# Patient Record
Sex: Female | Born: 1995 | Race: White | Hispanic: No | Marital: Single | Smoking: Never smoker
Health system: Southern US, Community
[De-identification: ages and names within clinical notes are randomized; demographics above are authoritative.]

## PROBLEM LIST (undated history)

## (undated) DIAGNOSIS — F39 Unspecified mood [affective] disorder: Secondary | ICD-10-CM

## (undated) DIAGNOSIS — F419 Anxiety disorder, unspecified: Secondary | ICD-10-CM

## (undated) DIAGNOSIS — F32A Depression, unspecified: Secondary | ICD-10-CM

## (undated) DIAGNOSIS — N946 Dysmenorrhea, unspecified: Secondary | ICD-10-CM

## (undated) DIAGNOSIS — G8929 Other chronic pain: Secondary | ICD-10-CM

## (undated) HISTORY — DX: Dysmenorrhea, unspecified: N94.6

## (undated) HISTORY — DX: Anxiety disorder, unspecified: F41.9

## (undated) HISTORY — DX: Other chronic pain: G89.29

## (undated) HISTORY — DX: Unspecified mood (affective) disorder: F39

## (undated) HISTORY — DX: Depression, unspecified: F32.A

## (undated) HISTORY — PX: NO PAST SURGERIES: SHX2092

---
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
## 2015-06-10 ENCOUNTER — Emergency Department (HOSPITAL_COMMUNITY)
Admission: EM | Admit: 2015-06-10 | Discharge: 2015-06-10 | Disposition: A | Discharge status: Home / Self Care | Payer: 59 | Attending: Emergency Medicine | Admitting: Emergency Medicine

## 2015-06-10 ENCOUNTER — Encounter (HOSPITAL_COMMUNITY): Payer: Self-pay | Admitting: *Deleted

## 2015-06-10 DIAGNOSIS — R21 Rash and other nonspecific skin eruption: Secondary | ICD-10-CM | POA: Diagnosis not present

## 2015-06-10 DIAGNOSIS — M79669 Pain in unspecified lower leg: Secondary | ICD-10-CM | POA: Diagnosis present

## 2015-06-10 DIAGNOSIS — F17210 Nicotine dependence, cigarettes, uncomplicated: Secondary | ICD-10-CM | POA: Insufficient documentation

## 2015-06-10 DIAGNOSIS — R000 Tachycardia, unspecified: Secondary | ICD-10-CM | POA: Insufficient documentation

## 2015-06-10 DIAGNOSIS — R252 Cramp and spasm: Secondary | ICD-10-CM | POA: Diagnosis not present

## 2015-06-10 DIAGNOSIS — Z87440 Personal history of urinary (tract) infections: Secondary | ICD-10-CM | POA: Insufficient documentation

## 2015-06-10 LAB — BASIC METABOLIC PANEL
Anion gap: 7 (ref 5–15)
BUN: 15 mg/dL (ref 6–20)
CO2: 26 mmol/L (ref 22–32)
Calcium: 9.2 mg/dL (ref 8.9–10.3)
Chloride: 105 mmol/L (ref 101–111)
Creatinine, Ser: 0.74 mg/dL (ref 0.44–1.00)
GFR calc Af Amer: >60 mL/min (ref >60)
GFR calc non Af Amer: >60 mL/min (ref >60)
Glucose, Bld: 89 mg/dL (ref 65–99)
Potassium: 4 mmol/L (ref 3.5–5.1)
Sodium: 138 mmol/L (ref 135–145)

## 2015-06-10 LAB — CBC
HCT: 42.1 % (ref 36.0–46.0)
Hemoglobin: 14.7 g/dL (ref 12.0–15.0)
MCH: 32 pg (ref 26.0–34.0)
MCHC: 34.9 g/dL (ref 30.0–36.0)
MCV: 91.7 fL (ref 78.0–100.0)
Platelets: 255 10*3/uL (ref 150–400)
RBC: 4.59 MIL/uL (ref 3.87–5.11)
RDW: 11.9 % (ref 11.5–15.5)
WBC: 8.5 10*3/uL (ref 4.0–10.5)

## 2015-06-10 LAB — CK: Total CK: 80 U/L (ref 38–234)

## 2015-06-10 MED ORDER — IBUPROFEN 400 MG PO TABS
400.0000 mg | ORAL_TABLET | Freq: Once | ORAL | Status: DC
  Filled 2015-06-10: qty 1

## 2015-06-10 NOTE — ED Notes (Addendum)
Pt states pain to lower legs, stating "swelling behind both knees with pain going down both of my legs, towards the outside." Symptoms x 2-3 days. Rash to lower legs as well which began after beginning bactrim for a UTI, recently switched to keflex. No obvious swelling, no pain with palpation at this time.

## 2015-06-10 NOTE — ED Provider Notes (Signed)
CSN: [id number]     Arrival date & time 06/10/15  1529 History   First MD Initiated Contact with Patient 06/10/15 1614     Chief Complaint  Patient presents with  . Leg Pain     (Consider location/radiation/quality/duration/timing/severity/associated sxs/prior Treatment) HPI Sally Pittman is a 19 y.o. female who presents to the ED with lower extremity pain x 2 days. Patient reports rash after taking Bactrim for a UTI and being changed to Keflex. She continues to have the rash. She does not feel that is related to the pain in her legs. Patient states that she feels like there is swelling behind her knees. She denies shortness of breath or other problems. Patient has implant for birth control.   History reviewed. No pertinent past medical history. History reviewed. No pertinent past surgical history. No family history on file. Social History  Substance Use Topics  . Smoking status: Current Some Day Smoker    Types: Cigarettes  . Smokeless tobacco: None  . Alcohol Use: No   OB History    No data available     Review of Systems Negative except as stated in HPI   Allergies  Bactrim  Home Medications   Prior to Admission medications   Not on File   BP 110/70 mmHg  Pulse 80  Temp(Src) 98 F (36.7 C) (Oral)  Resp 18  Ht 5\' 4"  (1.626 m)  Wt 40.824 kg  BMI 15.44 kg/m2  SpO2 100%  LMP  Physical Exam  Constitutional: She is oriented to person, place, and time. She appears well-developed and well-nourished.  HENT:  Head: Normocephalic and atraumatic.  Eyes: EOM are normal.  Neck: Neck supple.  Cardiovascular: Regular rhythm and intact distal pulses.  Tachycardia present.   Pulmonary/Chest: Effort normal and breath sounds normal.  Abdominal: There is no tenderness.  Musculoskeletal: Normal range of motion.  Pedal pulses 2+ bilateral, adequate circulation, good touch sensation, equal strength. Dorsi flexion and plantar flexion without difficulty. Unable to reproduce the  cramp like pain that the patient describes that comes and goes. No calf tenderness, no swelling noted. Full range of motion of lower extremities without pain.   Neurological: She is alert and oriented to person, place, and time. No cranial nerve deficit.  Skin: Skin is warm and dry. Rash noted.  Psychiatric: She has a normal mood and affect. Her behavior is normal.  Nursing note and vitals reviewed.   ED Course  Procedures (including critical care time) Dr. Manus Gunningancour in to examine the patient and discuss plan for labs.   Labs Review Results for orders placed or performed during the hospital encounter of 06/10/15 (from the past 24 hour(s))  CK     Status: None   Collection Time: 06/10/15  6:01 PM  Result Value Ref Range   Total CK 80 38 - 234 U/L  CBC     Status: None   Collection Time: 06/10/15  6:01 PM  Result Value Ref Range   WBC 8.5 4.0 - 10.5 K/uL   RBC 4.59 3.87 - 5.11 MIL/uL   Hemoglobin 14.7 12.0 - 15.0 g/dL   HCT 40.942.1 81.136.0 - 91.446.0 %   MCV 91.7 78.0 - 100.0 fL   MCH 32.0 26.0 - 34.0 pg   MCHC 34.9 30.0 - 36.0 g/dL   RDW 78.211.9 95.611.5 - 21.315.5 %   Platelets 255 150 - 400 K/uL  Basic metabolic panel     Status: None   Collection Time: 06/10/15  6:01 PM  Result Value Ref Range   Sodium 138 135 - 145 mmol/L   Potassium 4.0 3.5 - 5.1 mmol/L   Chloride 105 101 - 111 mmol/L   CO2 26 22 - 32 mmol/L   Glucose, Bld 89 65 - 99 mg/dL   BUN 15 6 - 20 mg/dL   Creatinine, Ser 1.61 0.44 - 1.00 mg/dL   Calcium 9.2 8.9 - 09.6 mg/dL   GFR calc non Af Amer >60 >60 mL/min   GFR calc Af Amer >60 >60 mL/min   Anion gap 7 5 - 15     MDM  18 y.o. female with bilateral leg cramps off and on x 2 days stable for d/c without focal neuro deficits and no calf tenderness on exam. No concern at this time for DVT or PE. Discussed with the patient and her mother clinical and lab findings and need for follow up with PCP.    Final diagnoses:  Cramp of both lower extremities       Janne Napoleon,  NP 06/10/15 1941  Glynn Octave, MD 06/11/15 [phone]

## 2015-06-10 NOTE — Discharge Instructions (Signed)
All of your blood work was normal today. Be sure you are drinking plenty of fluids to prevent dehydration. Take ibuprofen as needed for pain. Follow up with your doctor. Return here as needed for any problems.

## 2015-06-10 NOTE — ED Notes (Signed)
Pt's mother came out to desk.  Informed labs were resulted and provider would be in shortly.

## 2015-06-10 NOTE — ED Notes (Signed)
Pt talking on cell phone.  Given warm blanket.  Mother becoming agitated at wait time.

## 2015-12-06 ENCOUNTER — Encounter: Payer: Self-pay | Admitting: *Deleted

## 2015-12-07 ENCOUNTER — Encounter: Payer: 59 | Admitting: Cardiology

## 2015-12-07 ENCOUNTER — Encounter: Payer: Self-pay | Admitting: Cardiology

## 2015-12-07 NOTE — Progress Notes (Signed)
No show  This encounter was created in error - please disregard.

## 2015-12-17 ENCOUNTER — Encounter: Payer: Self-pay | Admitting: Cardiovascular Disease

## 2015-12-17 ENCOUNTER — Ambulatory Visit (INDEPENDENT_AMBULATORY_CARE_PROVIDER_SITE_OTHER): Payer: 59 | Admitting: Cardiovascular Disease

## 2015-12-17 VITALS — BP 100/78 | HR 69 | Ht 64.0 in | Wt 97.0 lb

## 2015-12-17 DIAGNOSIS — R002 Palpitations: Secondary | ICD-10-CM | POA: Diagnosis not present

## 2015-12-17 DIAGNOSIS — R9431 Abnormal electrocardiogram [ECG] [EKG]: Secondary | ICD-10-CM

## 2015-12-17 DIAGNOSIS — IMO0001 Reserved for inherently not codable concepts without codable children: Secondary | ICD-10-CM

## 2015-12-17 DIAGNOSIS — R030 Elevated blood-pressure reading, without diagnosis of hypertension: Secondary | ICD-10-CM

## 2015-12-17 DIAGNOSIS — Z136 Encounter for screening for cardiovascular disorders: Secondary | ICD-10-CM | POA: Diagnosis not present

## 2015-12-17 DIAGNOSIS — F410 Panic disorder [episodic paroxysmal anxiety] without agoraphobia: Secondary | ICD-10-CM

## 2015-12-17 DIAGNOSIS — F419 Anxiety disorder, unspecified: Secondary | ICD-10-CM

## 2015-12-17 NOTE — Progress Notes (Signed)
Patient ID: Sally Pittman, female   DOB: 11/29/1995, 20 y.o.   MRN: [id number]       CARDIOLOGY CONSULT NOTE  Patient ID: Sally Pittman MRN: [id number] DOB/AGE: 38/07/1995 20 y.o.  Admit date: (Not on file) Primary Physician: Remus LofflerJones, Angel S, PA Referring Physician:   Reason for Consultation: htn, abnormal ecg   HPI: The patient is a 20 year old female who is referred for the evaluation of an abnormal ECG and high blood pressure. ECG performed in the office today which I personally interpreted demonstrated sinus rhythm with RSR prime pattern in leads V1 and V2.  She has been under a lot of stress. Her boyfriend recently started drinking. She has a history of anxiety and panic disorder with panic attacks. She previously tried citalopram and it helped but then she stopped taking it. She restarted it 2 weeks ago. She also takes Xanax and trazodone as needed for sleep. She has a sensation of her "heart beat stopping and skipping". She has associated "hot and cold flashes". She denies exertional chest pain and shortness of breath. She said her blood pressure is always normal at her PCPs office but has been [facility]. She does not recall what the values were. She's had these symptoms since middle school. She has a list of behavioral therapists but is yet to make an appointment. She said her mother and father also make life very stressful for her. Her appetite has been poor over the past 5 days.   Allergies  Allergen Reactions  . Tobacco [Nicotiana Tabacum]     Nausea/dizziness  . Bactrim [Sulfamethoxazole-Trimethoprim] Rash    Current Outpatient Prescriptions  Medication Sig Dispense Refill  . ALPRAZolam (XANAX) 0.5 MG tablet Take 0.5 mg by mouth at bedtime as needed for anxiety.    . citalopram (CELEXA) 20 MG tablet Take 10 mg by mouth daily.     Marland Kitchen. etonogestrel (IMPLANON) 68 MG IMPL implant 1 each by Subdermal route once.    Marland Kitchen. UNKNOWN TO PATIENT Medication for nervousness     . UNKNOWN TO PATIENT Antibiotic for fungus     No current facility-administered medications for this visit.    Past Medical History  Diagnosis Date  . Mood disorder (HCC)   . Chronic pain   . Dysmenorrhea     Past Surgical History  Procedure Laterality Date  . No past surgeries      Social History   Social History  . Marital Status: Single    Spouse Name: N/A  . Number of Children: N/A  . Years of Education: N/A   Occupational History  . Not on file.   Social History Main Topics  . Smoking status: Never Smoker   . Smokeless tobacco: Never Used  . Alcohol Use: No  . Drug Use: Not on file  . Sexual Activity: Not on file   Other Topics Concern  . Not on file   Social History Narrative     No family history of premature CAD in 1st degree relatives.  Prior to Admission medications   Medication Sig Start Date End Date Taking? Authorizing Provider  citalopram (CELEXA) 20 MG tablet Take 20 mg by mouth daily.    Historical Provider, MD  etonogestrel (IMPLANON) 68 MG IMPL implant 1 each by Subdermal route once.    Historical Provider, MD     Review of systems complete and found to be negative unless listed above in HPI     Physical exam Blood pressure  100/78, pulse 69, height 5\' 4"  (1.626 m), weight 97 lb (43.999 kg), SpO2 99 %. General: NAD Neck: No JVD, no thyromegaly or thyroid nodule.  Lungs: Clear to auscultation bilaterally with normal respiratory effort. CV: Nondisplaced PMI. Regular rate and rhythm, normal S1/S2, no S3/S4, no murmur.  No peripheral edema.  No carotid bruit.  Normal pedal pulses.  Abdomen: Soft, nontender, no hepatosplenomegaly, no distention.  Skin: Intact without lesions or rashes.  Neurologic: Alert and oriented x 3.  Psych: Normal affect. Extremities: No clubbing or cyanosis.  HEENT: Normal.   ECG: Most recent ECG reviewed.  Labs:   Lab Results  Component Value Date   WBC 8.5 06/10/2015   HGB 14.7 06/10/2015   HCT 42.1  06/10/2015   MCV 91.7 06/10/2015   PLT 255 06/10/2015   No results for input(s): NA, K, CL, CO2, BUN, CREATININE, CALCIUM, PROT, BILITOT, ALKPHOS, ALT, AST, GLUCOSE in the last 168 hours.  Invalid input(s): LABALBU Lab Results  Component Value Date   CKTOTAL 80 06/10/2015   No results found for: CHOL No results found for: HDL No results found for: LDLCALC No results found for: TRIG No results found for: CHOLHDL No results found for: LDLDIRECT       Studies: No results found.  ASSESSMENT AND PLAN:  1. Elevated BP: Controlled without meds. No changes.  2. Abnormal ECG: ECG performed in the office today which I personally interpreted demonstrated sinus rhythm with RSR prime pattern in leads V1 and V2. This is normal for age.  3. Palpitations with cold/hot flashes: Will check TSH. Likely related to anxiety and panic disorder.  4. Anxiety and panic disorder: I had a very lengthy discussion with her about the importance of both medical therapy combined with cognitive behavioral therapy. I also talked to her about anxiety alleviation techniques.  Dispo: fu prn.   Signed: Prentice DockerSuresh Mirella Gueye, M.D., F.A.C.C.  12/17/2015, 9:02 AM

## 2015-12-17 NOTE — Patient Instructions (Signed)
Your physician recommends that you schedule a follow-up appointment AS NEEDED WITH DR. Purvis SheffieldKONESWARAN   Your physician recommends that you continue on your current medications as directed. Please refer to the Current Medication list given to you today.  Your physician recommends that you return for lab work TSH  [facility]

## 2015-12-23 ENCOUNTER — Encounter: Payer: Self-pay | Admitting: *Deleted

## 2015-12-30 ENCOUNTER — Telehealth: Payer: Self-pay | Admitting: Cardiovascular Disease

## 2015-12-30 NOTE — Telephone Encounter (Signed)
Would like to know results from blood work °

## 2015-12-30 NOTE — Telephone Encounter (Signed)
Patient notified that thyroid lab was normal & letter was mailed for notification on 12/23/2015.

## 2016-03-06 ENCOUNTER — Other Ambulatory Visit: Payer: Self-pay | Admitting: *Deleted

## 2016-07-04 ENCOUNTER — Other Ambulatory Visit: Payer: Self-pay | Admitting: *Deleted

## 2016-07-12 ENCOUNTER — Other Ambulatory Visit: Payer: Self-pay

## 2016-07-12 NOTE — Telephone Encounter (Signed)
Denied, not under my care

## 2016-07-12 NOTE — Telephone Encounter (Signed)
Patient not seen here at our office. Is this your patient? [facility]

## 2016-07-19 ENCOUNTER — Other Ambulatory Visit: Payer: Self-pay | Admitting: Gastroenterology

## 2016-07-19 DIAGNOSIS — R1012 Left upper quadrant pain: Secondary | ICD-10-CM

## 2016-07-19 DIAGNOSIS — R110 Nausea: Secondary | ICD-10-CM

## 2016-07-21 ENCOUNTER — Ambulatory Visit
Admission: RE | Admit: 2016-07-21 | Discharge: 2016-07-21 | Disposition: A | Discharge status: Home / Self Care | Payer: 59 | Source: Ambulatory Visit | Attending: Gastroenterology | Admitting: Gastroenterology

## 2016-07-21 DIAGNOSIS — R110 Nausea: Secondary | ICD-10-CM

## 2016-07-21 DIAGNOSIS — R1012 Left upper quadrant pain: Secondary | ICD-10-CM

## 2017-06-20 IMAGING — RF DG UGI W/ HIGH DENSITY W/KUB
3 series · 15 of 22 positions shown · non-contrast
Comparison: Today's abdominal ultrasound.

CLINICAL DATA: Left upper quadrant pain with nausea and vomiting.

EXAM:
UPPER GI SERIES WITH KUB
TECHNIQUE: After obtaining a scout radiograph a routine upper GI series was
performed using thin and thick barium
FLUOROSCOPY TIME:  Fluoroscopy Time:  3 minutes and 54 seconds
Radiation Exposure Index (if provided by the fluoroscopic device):
158 mGy
Number of Acquired Spot Images: 8

[Series 1: one shot · 7 of 11 slices shown (1 of 2)]
[im 1/11]
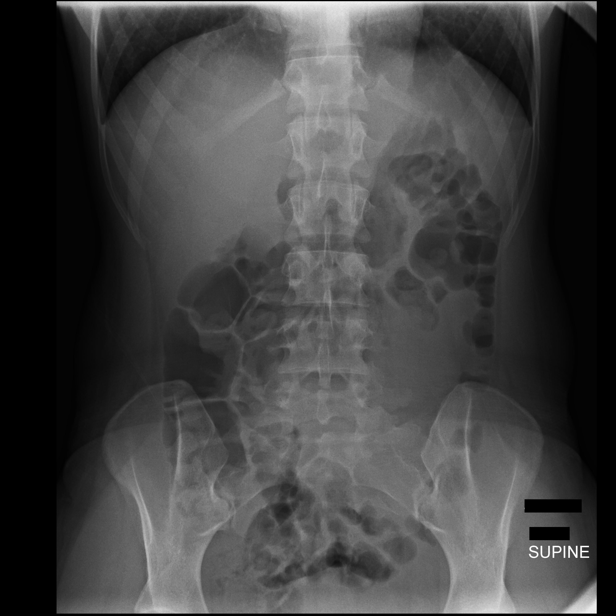
[im 3/11]
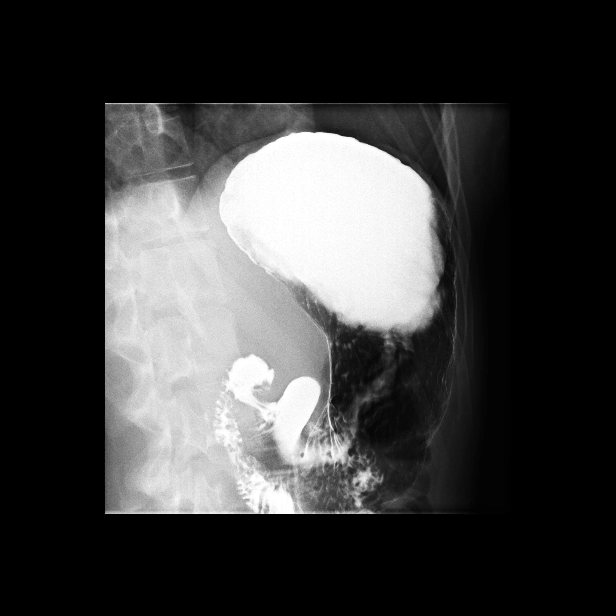
[im 4/11]
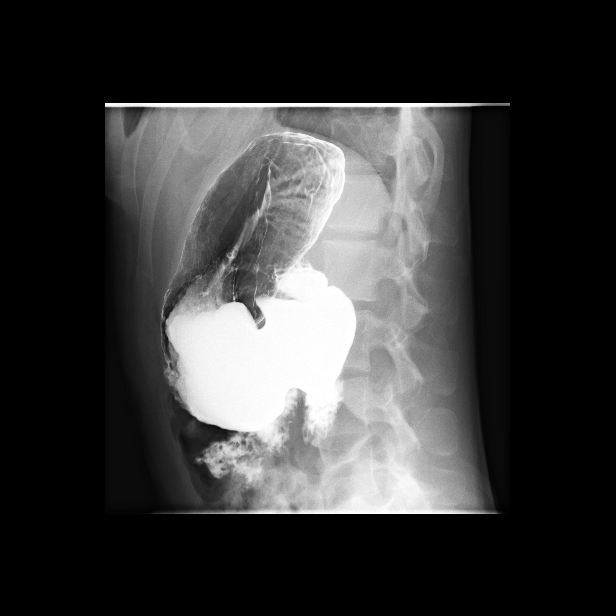
[im 6/11]
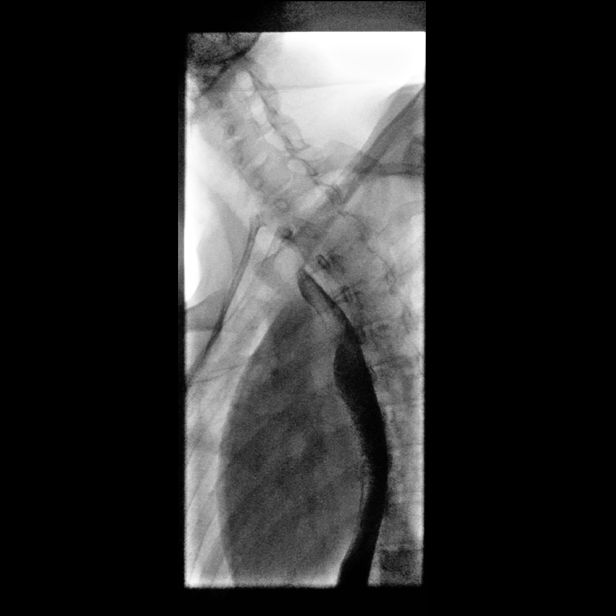
[im 7/11]
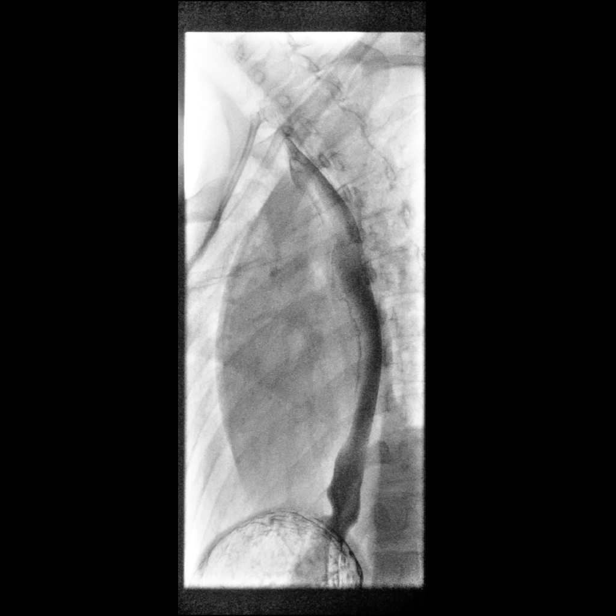
[im 9/11]
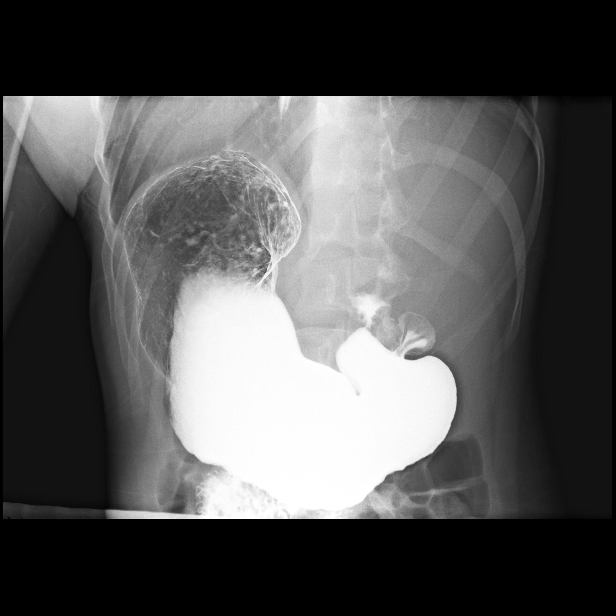
[im 10/11]
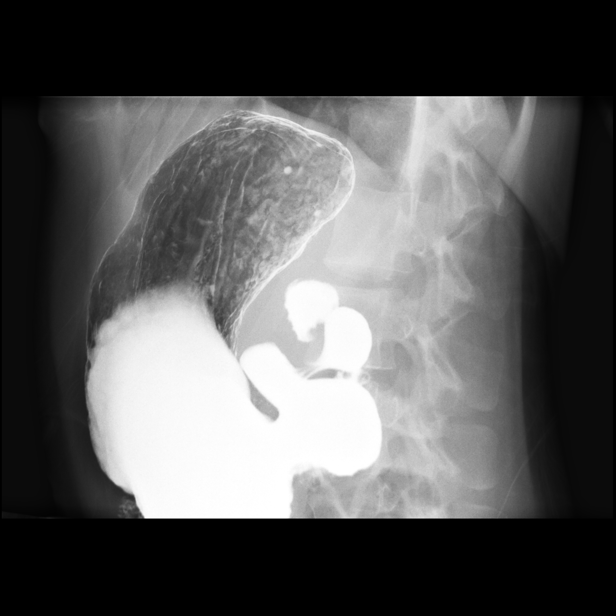

[Series 2: sequence · 2 acquisitions, 6 frames shown]
[im 1/2]
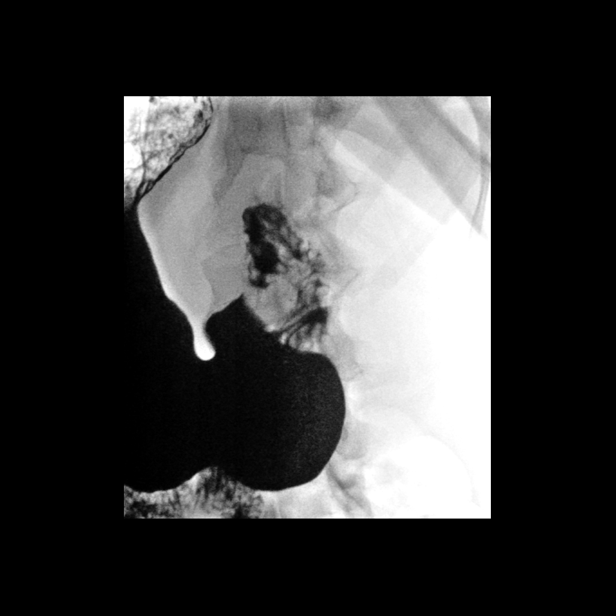
[im 1/2]
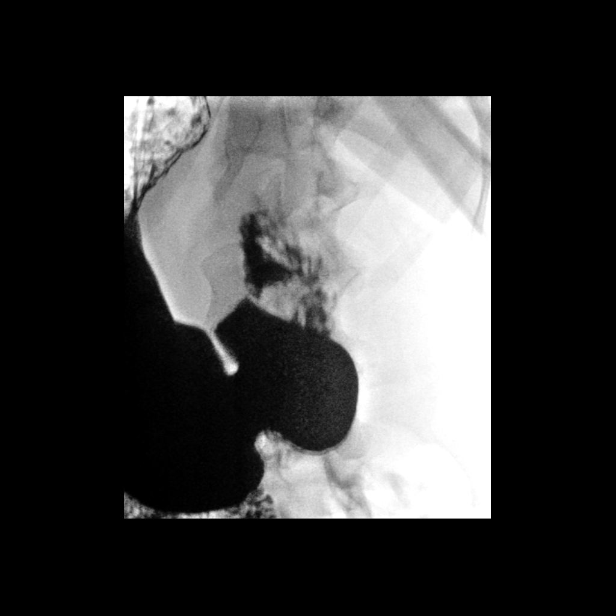
[im 1/2]
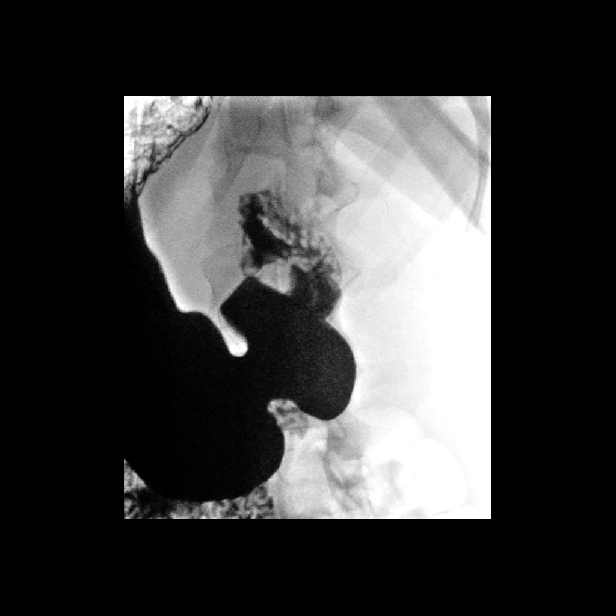
[im 2/2]
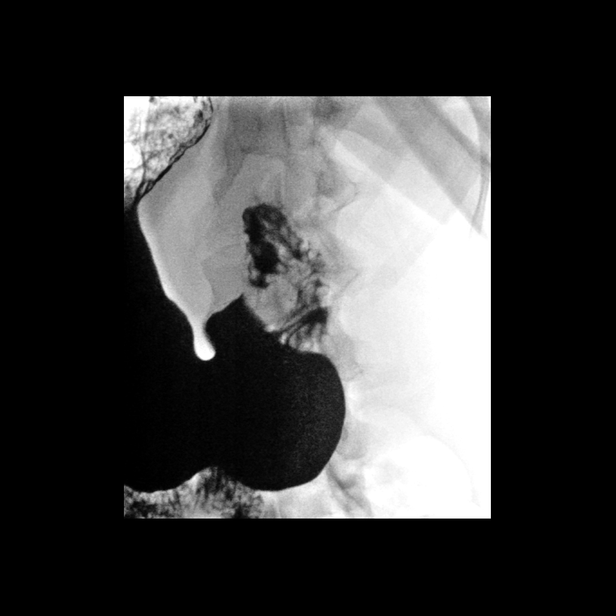
[im 2/2]
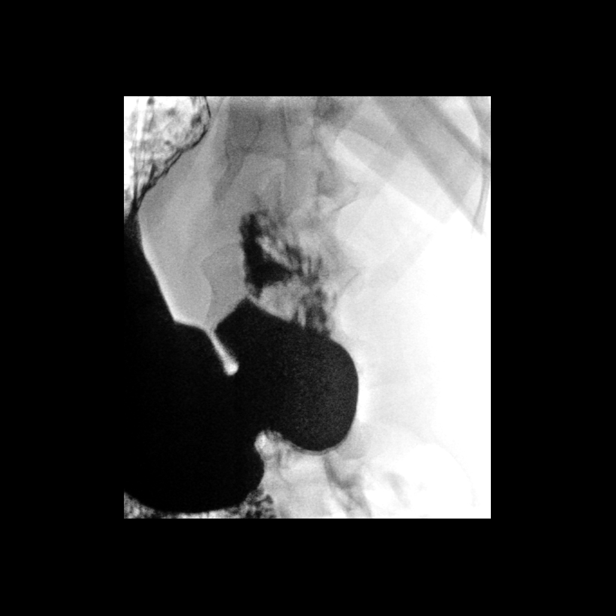
[im 2/2]
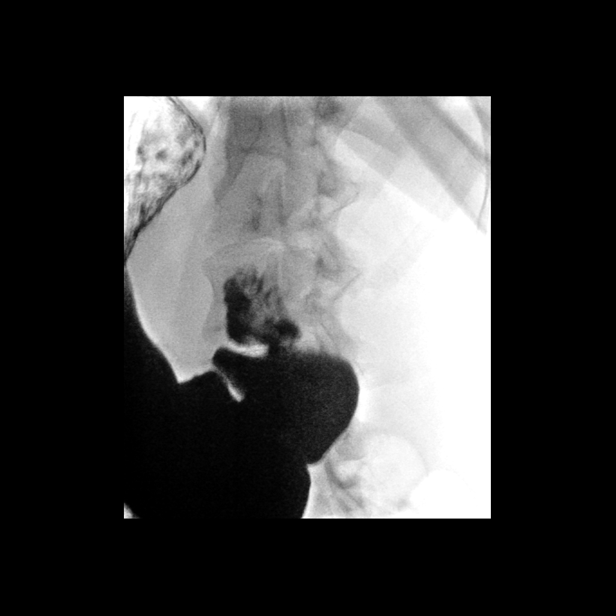

[Series 3: one shot · 2 of 3 slices shown (2 of 2)]
[im 1/3]
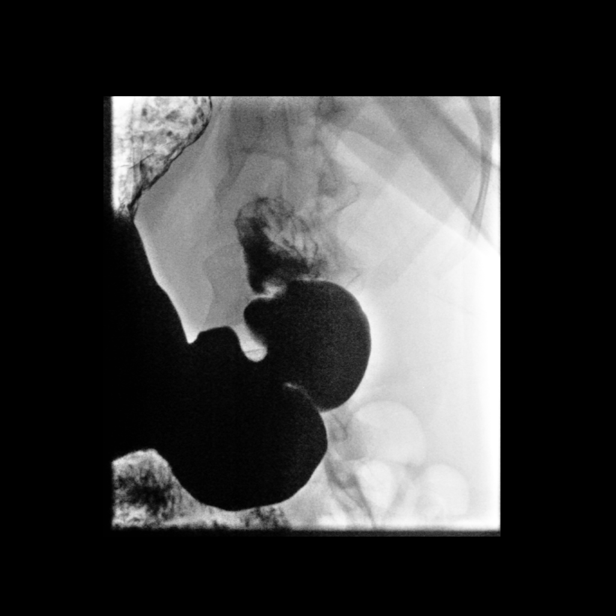
[im 3/3]
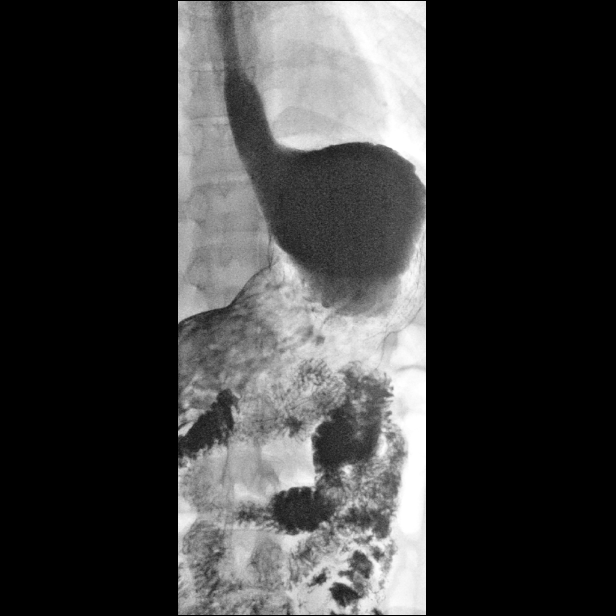

[15 of 22 positions shown; findings below may reference images not displayed]

FINDINGS: Preprocedure scout film is unremarkable.

Double contrast evaluation of the stomach demonstrates no mass,
ulcer, obstruction, or evidence of gastritis.

Full column evaluation of the esophagus demonstrates no persistent
narrowing or stricture. No hiatal hernia.

Normal appearance of the duodenal bulb and C-loop.

Spontaneous gastroesophageal reflux is identified to the mid
thoracic esophagus.
IMPRESSION: 1. Spontaneous gastroesophageal reflux.
2. Otherwise, normal upper GI.

## 2017-07-08 ENCOUNTER — Encounter (HOSPITAL_COMMUNITY): Payer: Self-pay | Admitting: *Deleted

## 2017-07-08 ENCOUNTER — Emergency Department (HOSPITAL_COMMUNITY)
Admission: EM | Admit: 2017-07-08 | Discharge: 2017-07-08 | Disposition: A | Discharge status: Home / Self Care | Payer: 59 | Attending: Emergency Medicine | Admitting: Emergency Medicine

## 2017-07-08 ENCOUNTER — Other Ambulatory Visit: Payer: Self-pay

## 2017-07-08 DIAGNOSIS — Z79899 Other long term (current) drug therapy: Secondary | ICD-10-CM | POA: Diagnosis not present

## 2017-07-08 DIAGNOSIS — T7840XA Allergy, unspecified, initial encounter: Secondary | ICD-10-CM | POA: Insufficient documentation

## 2017-07-08 DIAGNOSIS — Y999 Unspecified external cause status: Secondary | ICD-10-CM | POA: Insufficient documentation

## 2017-07-08 DIAGNOSIS — Y929 Unspecified place or not applicable: Secondary | ICD-10-CM | POA: Diagnosis not present

## 2017-07-08 DIAGNOSIS — X58XXXA Exposure to other specified factors, initial encounter: Secondary | ICD-10-CM | POA: Diagnosis not present

## 2017-07-08 DIAGNOSIS — Y939 Activity, unspecified: Secondary | ICD-10-CM | POA: Diagnosis not present

## 2017-07-08 MED ORDER — LORATADINE 10 MG PO TABS: 10.0000 mg | ORAL_TABLET | Freq: Every day | ORAL | 0 refills | Status: DC

## 2017-07-08 MED ORDER — RANITIDINE HCL 150 MG/10ML PO SYRP
150.0000 mg | ORAL_SOLUTION | Freq: Once | ORAL | Status: AC
  Administered 2017-07-08: 150 mg via ORAL
  Filled 2017-07-08: qty 10

## 2017-07-08 MED ORDER — LORATADINE 10 MG PO TABS
10.0000 mg | ORAL_TABLET | Freq: Once | ORAL | Status: AC
  Administered 2017-07-08: 10 mg via ORAL
  Filled 2017-07-08: qty 1

## 2017-07-08 MED ORDER — RANITIDINE HCL 150 MG PO CAPS: 150.0000 mg | ORAL_CAPSULE | Freq: Every day | ORAL | 0 refills | Status: DC

## 2017-07-08 MED ORDER — PREDNISONE 20 MG PO TABS
40.0000 mg | ORAL_TABLET | Freq: Once | ORAL | Status: AC
Start: 2017-07-08 — End: 2017-07-08
  Administered 2017-07-08: 40 mg via ORAL
  Filled 2017-07-08: qty 2

## 2017-07-08 NOTE — ED Notes (Signed)
Patient reports feeling better, ready to go home.

## 2017-07-08 NOTE — ED Provider Notes (Signed)
[facility] Provider Note   CSN: [id number] Arrival date & time: 07/08/17  1254     History   Chief Complaint Chief Complaint  Patient presents with  . Allergic Reaction    HPI Sally Pittman is a 22 y.o. female.  HPI   22 year old female presents today with complaints of allergic reaction.  Patient reports last night she was feeling tight and itching her face, she took Benadryl.  She woke up this morning and had redness and hives  in her face and neck and sensation of tightness.  She denies any intraoral, or any other rash to her body.  She denies any exposure to any new exposures, no new food or drink, no medications or body care products.  She denies any fever or systemic illnesses.  She denies any history of the same.  No allergic history.  Past Medical History:  Diagnosis Date  . Chronic pain   . Dysmenorrhea   . Mood disorder [facility](HCC)     Patient Active Problem List   Diagnosis Date Noted  . Palpitations 12/17/2015  . Anxiety disorder 12/17/2015    Past Surgical History:  Procedure Laterality Date  . NO PAST SURGERIES      OB History    No data available       Home Medications    Prior to Admission medications   Medication Sig Start Date End Date Taking? Authorizing Provider  ALPRAZolam Prudy Feeler(XANAX) 0.5 MG tablet Take 0.5 mg by mouth at bedtime as needed for anxiety.    [provider]  citalopram (CELEXA) 20 MG tablet Take 10 mg by mouth daily.     [provider]  etonogestrel (IMPLANON) 68 MG IMPL implant 1 each by Subdermal route once.    [provider]  loratadine (CLARITIN) 10 MG tablet Take 1 tablet (10 mg total) by mouth daily. 07/08/17   Aliah Eriksson, Tinnie GensJeffrey, PA-C  ranitidine (ZANTAC) 150 MG capsule Take 1 capsule (150 mg total) by mouth daily. 07/08/17   Cincere Deprey, Tinnie GensJeffrey, PA-C  UNKNOWN TO PATIENT Medication for nervousness    [provider]  UNKNOWN TO PATIENT Antibiotic for fungus     [provider]    Family History Family History  Problem Relation Age of Onset  . Hypertension Father   . Mood Disorder Mother   . Fibromyalgia Mother   . Mental illness Mother   . Hypertension Mother   . Arthritis Mother   . Skin cancer Mother   . Depression Mother     Social History Social History   Tobacco Use  . Smoking status: Never Smoker  . Smokeless tobacco: Never Used  Substance Use Topics  . Alcohol use: No    Alcohol/week: 0.0 oz  . Drug use: No     Allergies   Tobacco [nicotiana tabacum] and Bactrim [sulfamethoxazole-trimethoprim]   Review of Systems Review of Systems  All other systems reviewed and are negative.    Physical Exam Updated Vital Signs BP 137/89 (BP Location: Right Arm)   Pulse (!) 101   Temp 98.4 F (36.9 C) (Oral)   Resp 18   SpO2 100%   Physical Exam  Constitutional: She is oriented to person, place, and time. She appears well-developed and well-nourished.  HENT:  Head: Normocephalic and atraumatic.  Mouth/Throat: Uvula is midline, oropharynx is clear and moist and mucous membranes are normal. No oropharyngeal exudate, posterior oropharyngeal erythema or tonsillar abscesses. Tonsils are 0 on the right. Tonsils are  0 on the left. No tonsillar exudate.  Eyes: Conjunctivae are normal. Pupils are equal, round, and reactive to light. Right eye exhibits no discharge. Left eye exhibits no discharge. No scleral icterus.  Neck: Normal range of motion. No JVD present. No tracheal deviation present.  Cardiovascular: Normal rate, regular rhythm, normal heart sounds and intact distal pulses.  Pulmonary/Chest: Effort normal and breath sounds normal. No stridor. No respiratory distress. She has no wheezes. She has no rales. She exhibits no tenderness.  Neurological: She is alert and oriented to person, place, and time. Coordination normal.  Skin:  Erythematous rash to the face and neck  Psychiatric: She has a normal mood and affect.  Her behavior is normal. Judgment and thought content normal.  Nursing note and vitals reviewed.    ED Treatments / Results  Labs (all labs ordered are listed, but only abnormal results are displayed) Labs Reviewed - No data to display  EKG  EKG Interpretation  Date/Time:  "Sunday July 08 2017 13:08:49 EST Ventricular Rate:  85 PR Interval:  116 QRS Duration: 84 QT Interval:  360 QTC Calculation: 428 R Axis:   85 Text Interpretation:  Normal sinus rhythm with sinus arrhythmia Right atrial enlargement Borderline ECG Confirmed by James, Mark ([id number]) on 07/08/2017 1:18:25 PM Also confirmed by James, Mark ([id number]), editor Cassel, Kerry ([id number])  on 07/08/2017 2:43:34 PM       Radiology No results found.  Procedures Procedures (including critical care time)  Medications Ordered in ED Medications  loratadine (CLARITIN) tablet 10 mg (10 mg Oral Given 07/08/17 1509)  ranitidine (ZANTAC) 150 MG/10ML syrup 150 mg (150 mg Oral Given 07/08/17 1509)  predniSONE (DELTASONE) tablet 40 mg (40 mg Oral Given 07/08/17 1509)     Initial Impression / Assessment and Plan / ED Course  I have reviewed the triage vital signs and the nursing notes.  Pertinent labs & imaging results that were available during my care of the patient were reviewed by me and considered in my medical decision making (see chart for details).      Final Clinical Impressions(s) / ED Diagnoses   Final diagnoses:  Allergic reaction, initial encounter   Labs:   Imaging:  Consults:  Therapeutics: Claritin, Zantac prednisone  Discharge Meds: Claritin, Zantac  Assessment/Plan: 22 year old female presents today with complaints of allergic reaction.  Patient reports hives and redness to her face and neck.  She took Benadryl prior to arrival which did not improve her symptoms.  Nursing notes tightness in the throat, patient denies any swelling edema, or throat pain.  She denies any chest pain or shortness of breath.   Patient does note some anxiety and nausea surrounding the reaction.  Patient denies any history of the same, uncertain etiology at this time.  Patient was given medications here which dramatically improved her symptoms, very minimal redness noted.  Patient will be referred to allergy specialist, she will be encouraged to use antihistamines as needed, return for any new or worsening signs or symptoms.  She verbalized understanding and agreement to today's plan.      ED Discharge Orders        Ordered    loratadine (CLARITIN) 10 MG tablet  Daily     07/08/17 1633    ranitidine (ZANTAC) 150 MG capsule  Daily     01" /20/19 1633       Presly Steinruck, Tinnie Gens, PA-C 07/08/17 1710    Rolland Porter, MD 07/08/17 2147

## 2017-07-08 NOTE — Discharge Instructions (Signed)
Please read attached information. If you experience any new or worsening signs or symptoms please return to the emergency room for evaluation. Please follow-up with your primary care provider or specialist as discussed. Please use medication prescribed only as directed and discontinue taking if you have any concerning signs or symptoms.   °

## 2017-07-08 NOTE — ED Triage Notes (Signed)
PT states last night ate [employer] and started getting bumps under lower lip, then right cheek swelled and was itchy. It is now on both cheeks and red.  Then she started feeling faint  And sick on stomach.  Then she started feeling panicky at work.  No pain, just reports face stings and feels like her heart skips sometimes. No tightness in throat

## 2018-02-10 IMAGING — US US ABDOMEN COMPLETE
1 series · 14 of 25 positions shown · non-contrast
Comparison: None.

CLINICAL DATA: Left upper quadrant pain and nausea

EXAM:
ABDOMEN ULTRASOUND COMPLETE

[Series 1: us abdomen complete · 0.17mm/px · 14 of 72 slices shown]
[im 1/72]
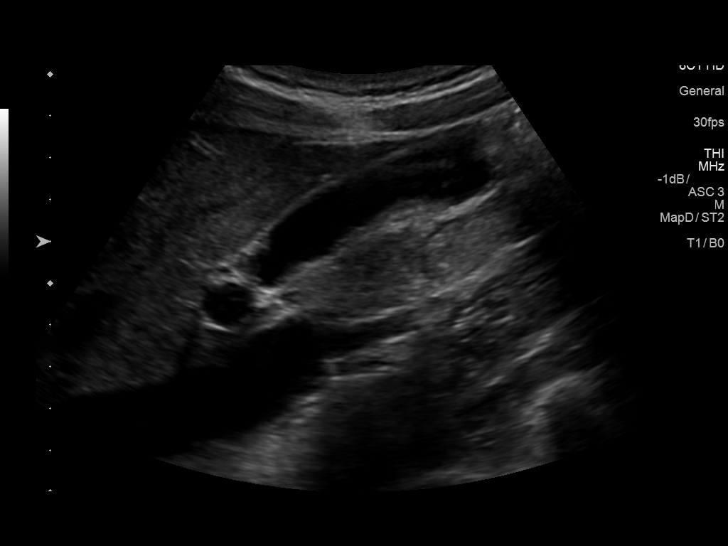
[im 6/72]
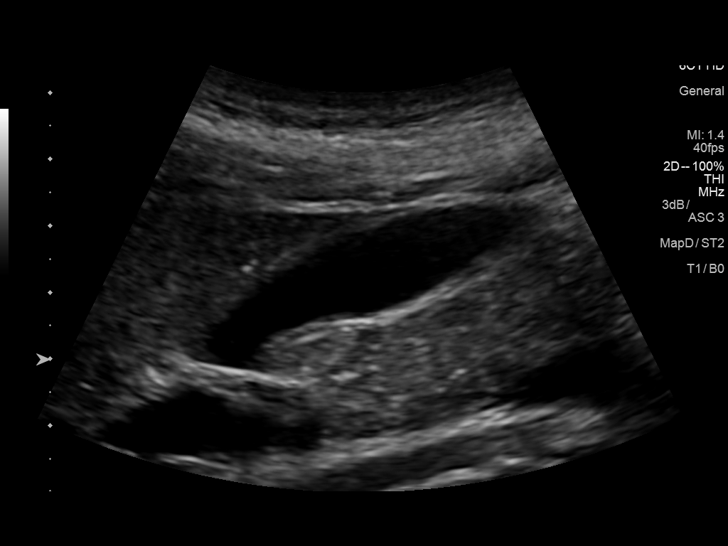
[im 12/72]
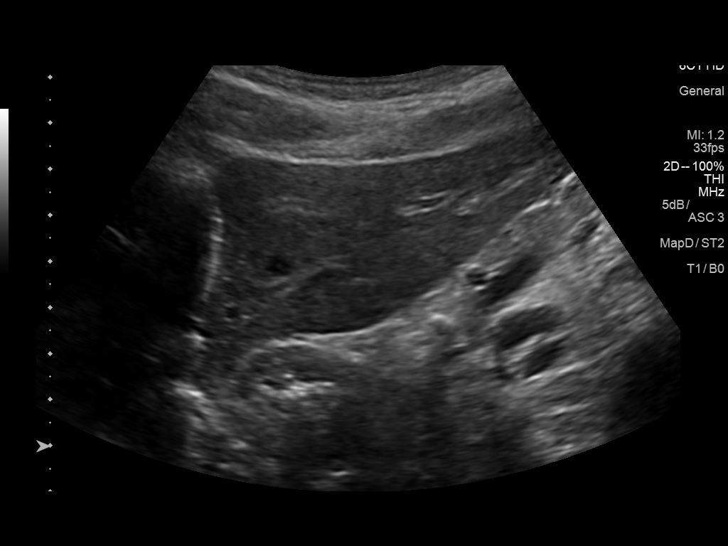
[im 18/72]
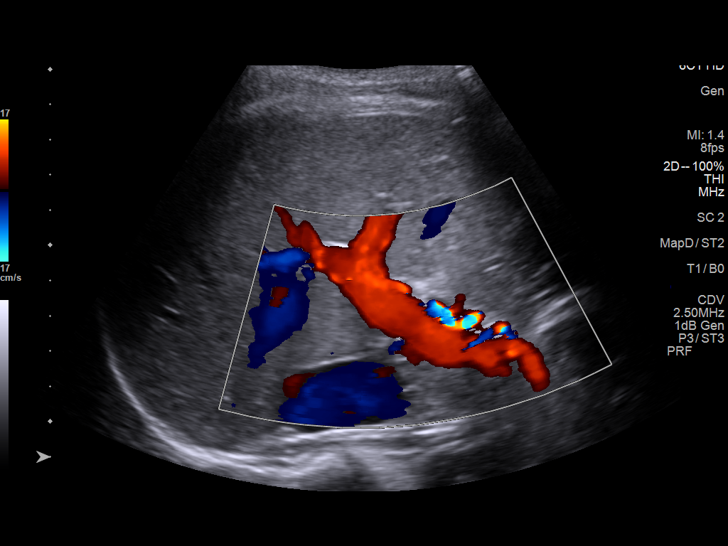
[im 24/72]
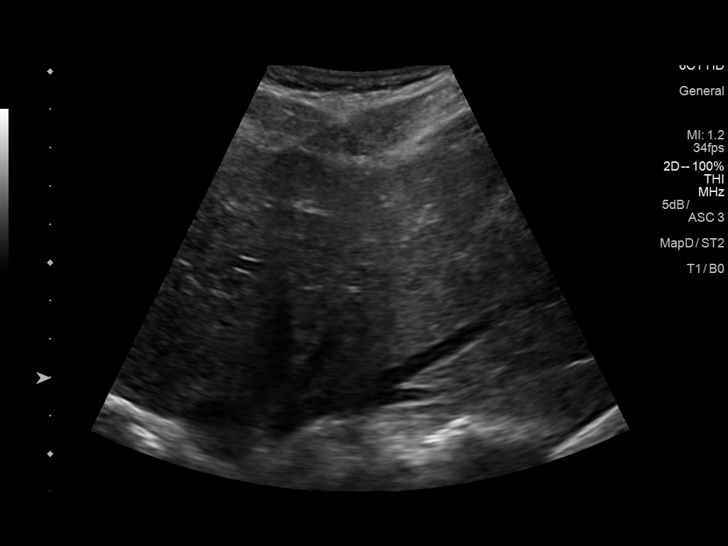
[im 27/72]
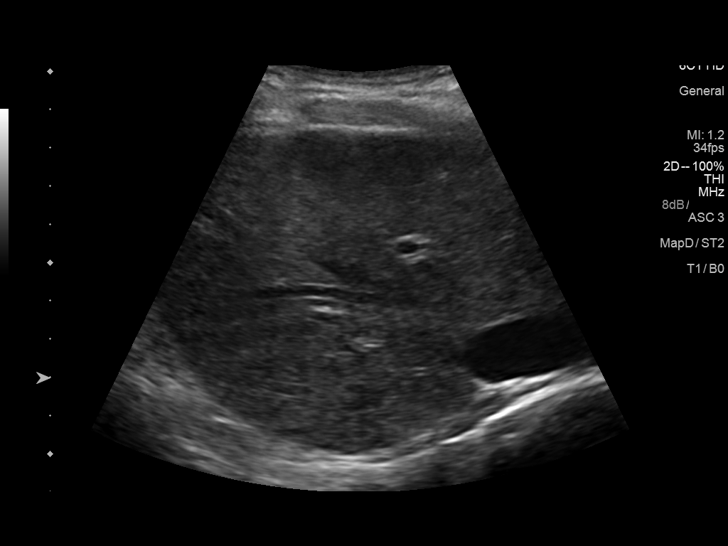
[im 33/72]
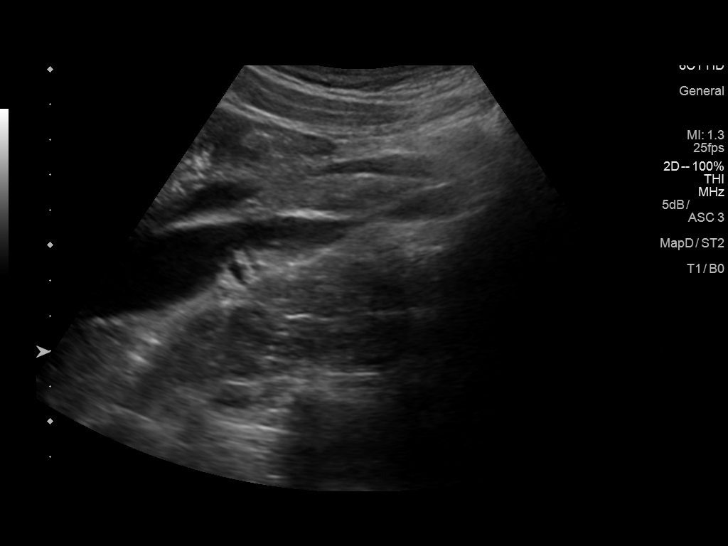
[im 39/72]
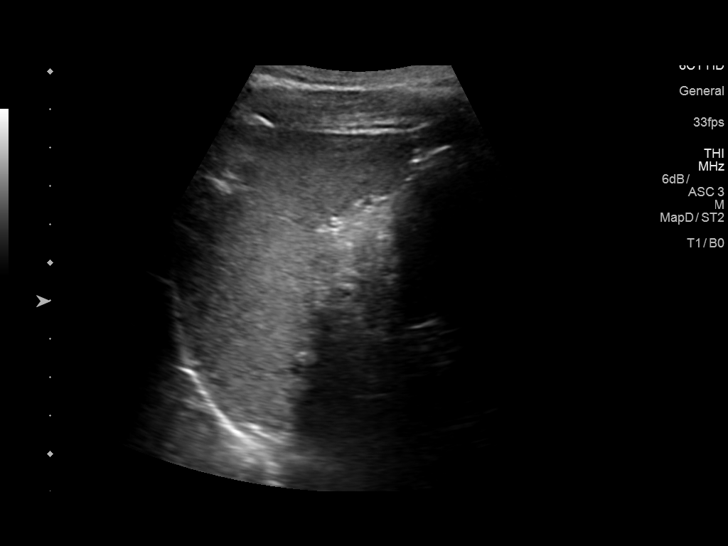
[im 45/72]
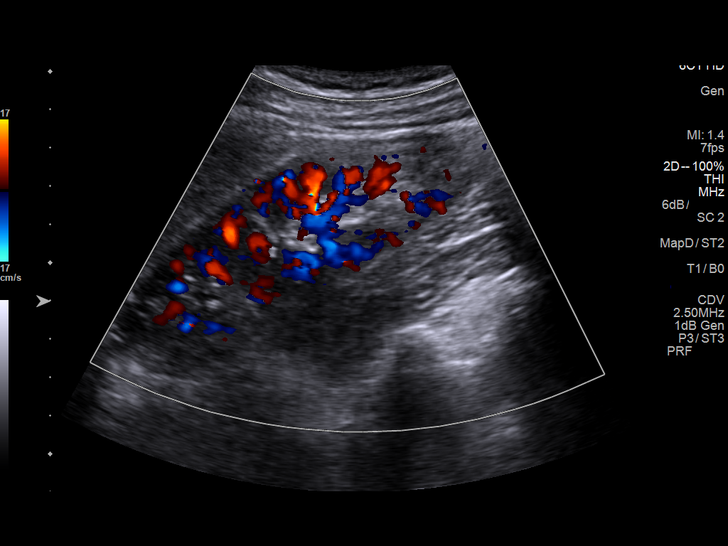
[im 48/72]
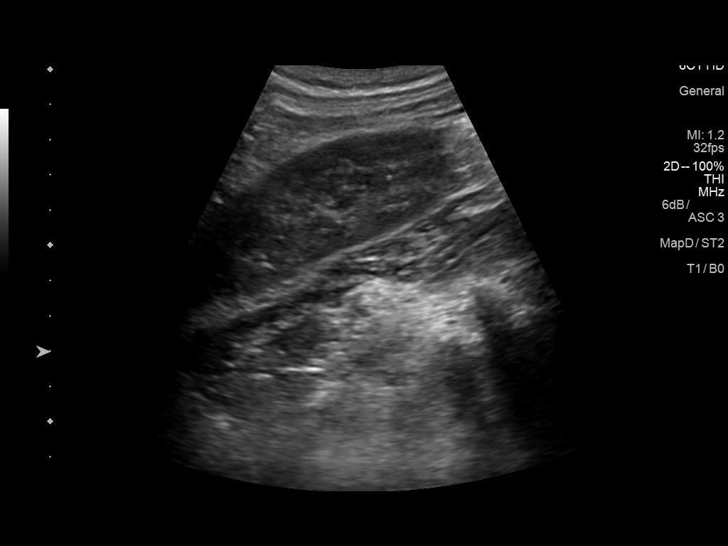
[im 54/72]
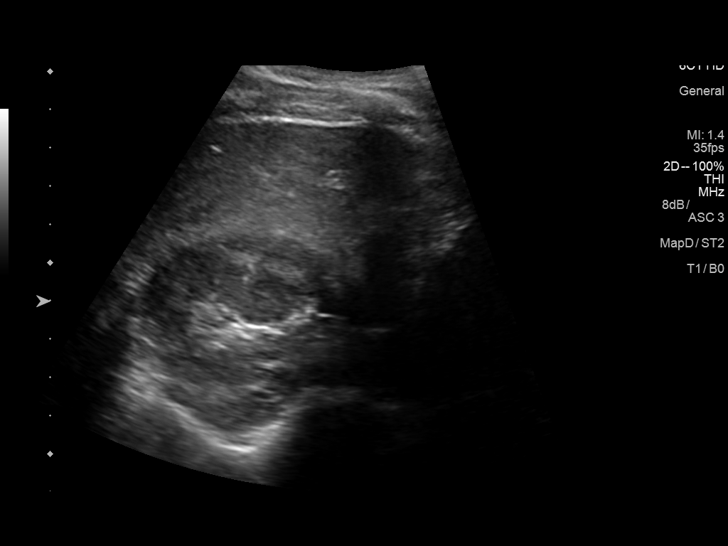
[im 60/72]
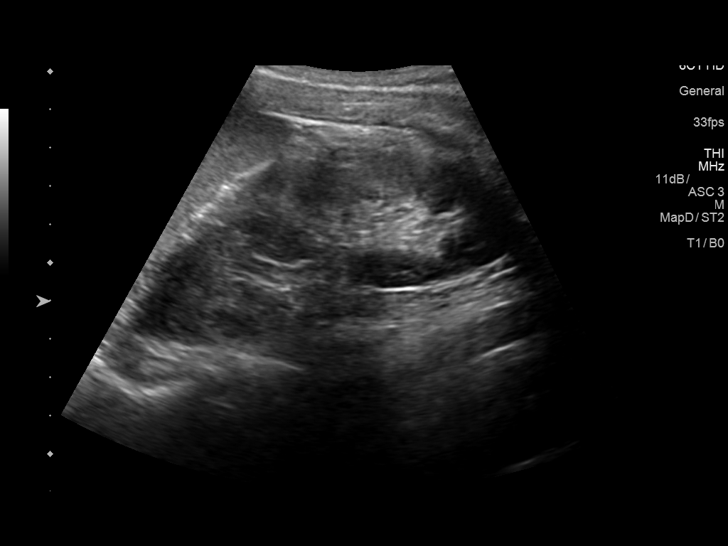
[im 66/72]
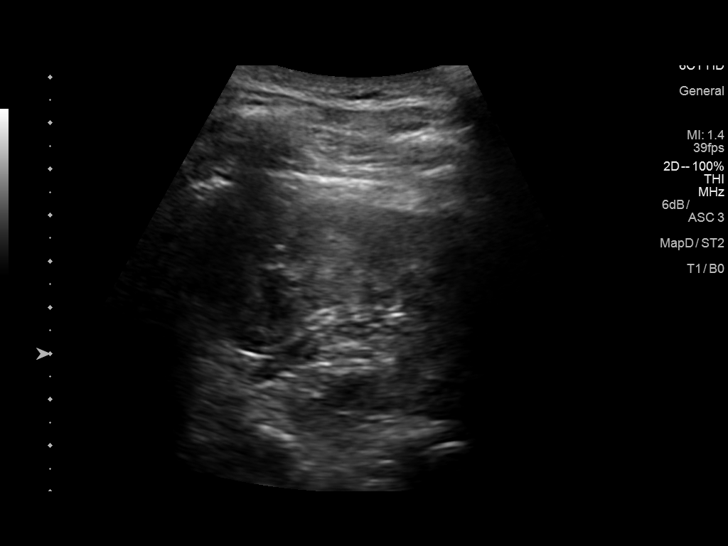
[im 72/72]
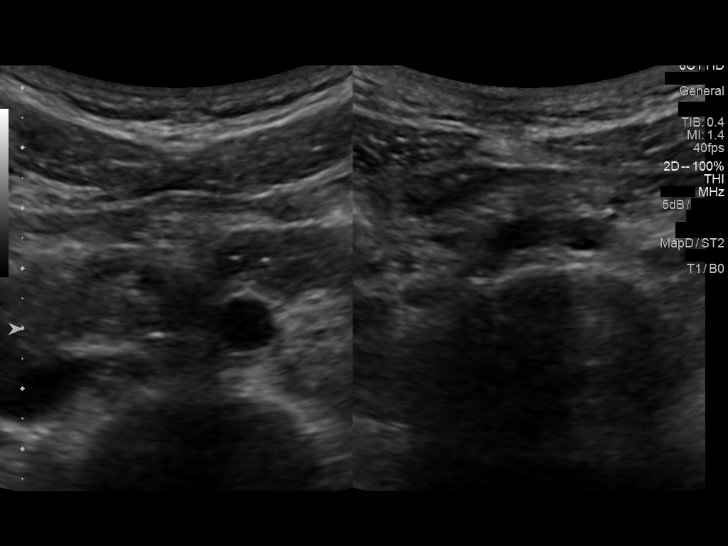

[14 of 25 positions shown; findings below may reference images not displayed]

FINDINGS: Gallbladder: No gallstones or wall thickening visualized. No
sonographic Murphy sign noted by sonographer.

Common bile duct: Diameter: 2.4 mm

Liver: No focal lesion identified. Within normal limits in
parenchymal echogenicity.

IVC: No abnormality visualized.

Pancreas: Visualized portion unremarkable.

Spleen: Size and appearance within normal limits.

Right Kidney: Length: 11.2 cm.. Echogenicity within normal limits.
No mass or hydronephrosis visualized.

Left Kidney: Length: 10.7 cm.. Echogenicity within normal limits. No
mass or hydronephrosis visualized.

Abdominal aorta: No aneurysm visualized.

Other findings: None.
IMPRESSION: No acute abnormality noted.

## 2018-04-11 ENCOUNTER — Other Ambulatory Visit: Payer: Self-pay | Admitting: Obstetrics and Gynecology

## 2018-04-11 ENCOUNTER — Other Ambulatory Visit (HOSPITAL_COMMUNITY)
Admission: RE | Admit: 2018-04-11 | Discharge: 2018-04-11 | Disposition: A | Discharge status: Home / Self Care | Payer: 59 | Source: Ambulatory Visit | Attending: Obstetrics and Gynecology | Admitting: Obstetrics and Gynecology

## 2018-04-11 DIAGNOSIS — Z01411 Encounter for gynecological examination (general) (routine) with abnormal findings: Secondary | ICD-10-CM | POA: Insufficient documentation

## 2018-04-15 LAB — CYTOLOGY - PAP: Diagnosis: NEGATIVE

## 2020-08-05 ENCOUNTER — Other Ambulatory Visit: Payer: Self-pay | Admitting: Obstetrics and Gynecology

## 2020-08-05 DIAGNOSIS — N6320 Unspecified lump in the left breast, unspecified quadrant: Secondary | ICD-10-CM

## 2020-08-09 ENCOUNTER — Encounter: Payer: Self-pay | Admitting: Nurse Practitioner

## 2020-08-09 ENCOUNTER — Other Ambulatory Visit: Payer: Self-pay

## 2020-08-09 ENCOUNTER — Ambulatory Visit: Payer: 59 | Admitting: Nurse Practitioner

## 2020-08-09 VITALS — BP 112/63 | HR 84 | Temp 98.3°F | Ht 64.0 in | Wt 112.4 lb

## 2020-08-09 DIAGNOSIS — F419 Anxiety disorder, unspecified: Secondary | ICD-10-CM | POA: Diagnosis not present

## 2020-08-09 MED ORDER — HYDROXYZINE HCL 10 MG PO TABS: 10.0000 mg | ORAL_TABLET | Freq: Three times a day (TID) | ORAL | 0 refills | Status: AC | PRN

## 2020-08-09 NOTE — Progress Notes (Signed)
New Patient Note  RE: Sally Pittman MRN: [id number] DOB: 04-Apr-1996 Date of Office Visit: 08/09/2020  Chief Complaint: Establish Care and Anxiety  History of Present Illness:   Anxiety: Patient complains of anxiety disorder.  She has the following symptoms: difficulty concentrating, feelings of losing control, insomnia, irritable. Onset of symptoms was approximately 3 years ago, unchanged since that time. She denies current suicidal and homicidal ideation. Family history significant for: mother: mood disorder.Possible organic causes contributing are: none. Risk factors: previous episode of depression Previous treatment includes patient not using any medication  .  She complains of the following side effects from the treatment: mood swings.  Assessment and Plan: Sally Pittman is a 25 y.o. female with: Anxiety disorder Patient's anxiety well controlled after switching to Vyvanse in the last 3 months.  Completed GAD 7 and PHQ-9.  Patient reports she has more mood issues and needs a mood stabilizer more than an anxiety or depression medication. Referral to psychiatry completed.  Atarax as needed ordered for patient.  Follow-up with worsening or unresolved symptoms.  Education provided with printed handouts given Rx sent to pharmacy.  GAD 7 : Generalized Anxiety Score 08/09/2020  Nervous, Anxious, on Edge 1  Control/stop worrying 3  Worry too much - different things 3  Trouble relaxing 1  Restless 0  Easily annoyed or irritable 1  Afraid - awful might happen 1  Total GAD 7 Score 10  Anxiety Difficulty Somewhat difficult   Flowsheet Row Office Visit from 08/09/2020 in [facility]  PHQ-9 Total Score 7      Return if symptoms worsen or fail to improve.   Diagnostics:   Past Medical History: Patient Active Problem List   Diagnosis Date Noted  . Palpitations 12/17/2015  . Anxiety disorder 12/17/2015   Past Medical History:  Diagnosis Date  . Anxiety   . Chronic  pain   . Depression   . Dysmenorrhea   . Mood disorder [facility])    Past Surgical History: Past Surgical History:  Procedure Laterality Date  . NO PAST SURGERIES     Medication List:  Current Outpatient Medications  Medication Sig Dispense Refill  . BIOTIN PO Take by mouth.    . hydrOXYzine (ATARAX/VISTARIL) 10 MG tablet Take 1 tablet (10 mg total) by mouth 3 (three) times daily as needed. 30 tablet 0  . lisdexamfetamine (VYVANSE) 10 MG capsule Take 10 mg by mouth at bedtime.    Marland Kitchen lisdexamfetamine (VYVANSE) 20 MG capsule Take 20 mg by mouth daily.    . metaxalone (SKELAXIN) 800 MG tablet Take 400 mg by mouth 4 (four) times daily as needed.    . Multiple Vitamins-Minerals (ONE-A-DAY WOMENS PO) Take by mouth.     No current facility-administered medications for this visit.   Allergies: Allergies  Allergen Reactions  . Tobacco [Tobacco]     Nausea/dizziness  . Bactrim [Sulfamethoxazole-Trimethoprim] Rash   Social History: Social History   Socioeconomic History  . Marital status: Single    Spouse name: Not on file  . Number of children: Not on file  . Years of education: Not on file  . Highest education level: 12th grade  Occupational History  . Not on file  Tobacco Use  . Smoking status: Never Smoker  . Smokeless tobacco: Never Used  Vaping Use  . Vaping Use: Never used  Substance and Sexual Activity  . Alcohol use: Yes    Alcohol/week: 0.0 standard drinks    Comment: socially  . Drug  use: Yes    Types: Marijuana  . Sexual activity: Yes    Birth control/protection: I.U.D.  Other Topics Concern  . Not on file  Social History Narrative  . Not on file   Social Determinants of Health   Financial Resource Strain: Not on file  Food Insecurity: Not on file  Transportation Needs: Not on file  Physical Activity: Not on file  Stress: Not on file  Social Connections: Not on file       Family History: Family History  Problem Relation Age of Onset  . Hypertension  Father   . Mood Disorder Mother   . Fibromyalgia Mother   . Mental illness Mother   . Hypertension Mother   . Arthritis Mother   . Depression Mother          Review of Systems  Constitutional: Negative.   HENT: Negative.   Eyes: Negative.   Respiratory: Negative.   Cardiovascular: Negative.   Gastrointestinal: Negative.   Endocrine: Negative.   Musculoskeletal: Negative.   Skin: Negative.   Psychiatric/Behavioral: Negative for agitation, self-injury, sleep disturbance and suicidal ideas. The patient is nervous/anxious. The patient is not hyperactive.   All other systems reviewed and are negative.  Objective: BP 112/63   Pulse 84   Temp 98.3 F (36.8 C)   Ht 5\' 4"  (1.626 m)   Wt 112 lb 6.4 oz (51 kg)   LMP 08/01/2020   SpO2 100%   BMI 19.29 kg/m  Body mass index is 19.29 kg/m. Physical Exam Vitals reviewed.  Constitutional:      Appearance: Normal appearance.  HENT:     Head: Normocephalic.     Nose: Nose normal.  Eyes:     Conjunctiva/sclera: Conjunctivae normal.  Cardiovascular:     Rate and Rhythm: Normal rate and regular rhythm.     Pulses: Normal pulses.     Heart sounds: Normal heart sounds.  Pulmonary:     Effort: Pulmonary effort is normal.     Breath sounds: Normal breath sounds.  Abdominal:     General: Bowel sounds are normal.  Musculoskeletal:        General: Normal range of motion.  Skin:    General: Skin is warm.  Neurological:     Mental Status: She is alert and oriented to person, place, and time.  Psychiatric:     Comments: Depression and depression    The plan was reviewed with the patient/family, and all questions/concerned were addressed.  It was my pleasure to see Sally Pittman today and participate in her care. Please feel free to contact me with any questions or concerns.  Sincerely,  Lesly Rubenstein NP [facility]

## 2020-08-09 NOTE — Assessment & Plan Note (Signed)
Patient's anxiety well controlled after switching to Vyvanse in the last 3 months.  Completed GAD 7 and PHQ-9.  Patient reports she has more mood issues and needs a mood stabilizer more than an anxiety or depression medication. Referral to psychiatry completed.  Atarax as needed ordered for patient.  Follow-up with worsening or unresolved symptoms.  Education provided with printed handouts given Rx sent to pharmacy.

## 2020-08-09 NOTE — Patient Instructions (Signed)
[email or web address]">  Generalized Anxiety Disorder, Adult Generalized anxiety disorder (GAD) is a mental health condition. Unlike normal worries, anxiety related to GAD is not triggered by a specific event. These worries do not fade or get better with time. GAD interferes with relationships, work, and school. GAD symptoms can vary from mild to severe. People with severe GAD can have intense waves of anxiety with physical symptoms that are similar to panic attacks. What are the causes? The exact cause of GAD is not known, but the following are believed to have an impact:  Differences in natural brain chemicals.  Genes passed down from parents to children.  Differences in the way threats are perceived.  Development during childhood.  Personality. What increases the risk? The following factors may make you more likely to develop this condition:  Being female.  Having a family history of anxiety disorders.  Being very shy.  Experiencing very stressful life events, such as the death of a loved one.  Having a very stressful family environment. What are the signs or symptoms? People with GAD often worry excessively about many things in their lives, such as their health and family. Symptoms may also include:  Mental and emotional symptoms: ? Worrying excessively about natural disasters. ? Fear of being late. ? Difficulty concentrating. ? Fears that others are judging your performance.  Physical symptoms: ? Fatigue. ? Headaches, muscle tension, muscle twitches, trembling, or feeling shaky. ? Feeling like your heart is pounding or beating very fast. ? Feeling out of breath or like you cannot take a deep breath. ? Having trouble falling asleep or staying asleep, or experiencing restlessness. ? Sweating. ? Nausea, diarrhea, or irritable bowel syndrome (IBS).  Behavioral symptoms: ? Experiencing erratic moods or irritability. ? Avoidance of new situations. ? Avoidance of  people. ? Extreme difficulty making decisions. How is this diagnosed? This condition is diagnosed based on your symptoms and medical history. You will also have a physical exam. Your health care provider may perform tests to rule out other possible causes of your symptoms. To be diagnosed with GAD, a person must have anxiety that:  Is out of his or her control.  Affects several different aspects of his or her life, such as work and relationships.  Causes distress that makes him or her unable to take part in normal activities.  Includes at least three symptoms of GAD, such as restlessness, fatigue, trouble concentrating, irritability, muscle tension, or sleep problems. Before your health care provider can confirm a diagnosis of GAD, these symptoms must be present more days than they are not, and they must last for 6 months or longer. How is this treated? This condition may be treated with:  Medicine. Antidepressant medicine is usually prescribed for long-term daily control. Anti-anxiety medicines may be added in severe cases, especially when panic attacks occur.  Talk therapy (psychotherapy). Certain types of talk therapy can be helpful in treating GAD by providing support, education, and guidance. Options include: ? Cognitive behavioral therapy (CBT). People learn coping skills and self-calming techniques to ease their physical symptoms. They learn to identify unrealistic thoughts and behaviors and to replace them with more appropriate thoughts and behaviors. ? Acceptance and commitment therapy (ACT). This treatment teaches people how to be mindful as a way to cope with unwanted thoughts and feelings. ? Biofeedback. This process trains you to manage your body's response (physiological response) through breathing techniques and relaxation methods. You will work with a therapist while machines are used to monitor your physical   symptoms.  Stress management techniques. These include yoga,  meditation, and exercise. A mental health specialist can help determine which treatment is best for you. Some people see improvement with one type of therapy. However, other people require a combination of therapies.   Follow these instructions at home: Lifestyle  Maintain a consistent routine and schedule.  Anticipate stressful situations. Create a plan, and allow extra time to work with your plan.  Practice stress management or self-calming techniques that you have learned from your therapist or your health care provider. General instructions  Take over-the-counter and prescription medicines only as told by your health care provider.  Understand that you are likely to have setbacks. Accept this and be kind to yourself as you persist to take better care of yourself.  Recognize and accept your accomplishments, even if you judge them as small.  Keep all follow-up visits as told by your health care provider. This is important. Contact a health care provider if:  Your symptoms do not get better.  Your symptoms get worse.  You have signs of depression, such as: ? A persistently sad or irritable mood. ? Loss of enjoyment in activities that used to bring you joy. ? Change in weight or eating. ? Changes in sleeping habits. ? Avoiding friends or family members. ? Loss of energy for normal tasks. ? Feelings of guilt or worthlessness. Get help right away if:  You have serious thoughts about hurting yourself or others. If you ever feel like you may hurt yourself or others, or have thoughts about taking your own life, get help right away. Go to your nearest emergency department or:  Call your local emergency services (911 in the [place]  Call a suicide crisis helpline, such as the National Suicide Prevention Lifeline at 1-800-273-8255. This is open 24 hours a day in the [place]  Text the Crisis Text Line at 741741 (in the [place] Summary  Generalized anxiety disorder (GAD) is a mental  health condition that involves worry that is not triggered by a specific event.  People with GAD often worry excessively about many things in their lives, such as their health and family.  GAD may cause symptoms such as restlessness, trouble concentrating, sleep problems, frequent sweating, nausea, diarrhea, headaches, and trembling or muscle twitching.  A mental health specialist can help determine which treatment is best for you. Some people see improvement with one type of therapy. However, other people require a combination of therapies. This information is not intended to replace advice given to you by your health care provider. Make sure you discuss any questions you have with your health care provider. Document Revised: 03/26/2019 Document Reviewed: 03/26/2019 Elsevier Patient Education  2021 [employer]  

## 2020-08-26 ENCOUNTER — Ambulatory Visit
Admission: RE | Admit: 2020-08-26 | Discharge: 2020-08-26 | Disposition: A | Discharge status: Home / Self Care | Payer: 59 | Source: Ambulatory Visit | Attending: Obstetrics and Gynecology | Admitting: Obstetrics and Gynecology

## 2020-08-26 ENCOUNTER — Other Ambulatory Visit: Payer: Self-pay

## 2020-08-26 ENCOUNTER — Other Ambulatory Visit: Payer: Self-pay | Admitting: Obstetrics and Gynecology

## 2020-08-26 DIAGNOSIS — N6320 Unspecified lump in the left breast, unspecified quadrant: Secondary | ICD-10-CM

## 2020-08-27 ENCOUNTER — Encounter: Payer: Self-pay | Admitting: Nurse Practitioner

## 2021-03-07 ENCOUNTER — Other Ambulatory Visit: Payer: 59

## 2023-08-14 DIAGNOSIS — F4010 Social phobia, unspecified: Secondary | ICD-10-CM | POA: Diagnosis not present

## 2023-08-14 DIAGNOSIS — F4310 Post-traumatic stress disorder, unspecified: Secondary | ICD-10-CM | POA: Diagnosis not present

## 2023-08-14 DIAGNOSIS — F411 Generalized anxiety disorder: Secondary | ICD-10-CM | POA: Diagnosis not present

## 2023-10-09 DIAGNOSIS — F4010 Social phobia, unspecified: Secondary | ICD-10-CM | POA: Diagnosis not present

## 2023-10-09 DIAGNOSIS — F4310 Post-traumatic stress disorder, unspecified: Secondary | ICD-10-CM | POA: Diagnosis not present

## 2023-10-09 DIAGNOSIS — F411 Generalized anxiety disorder: Secondary | ICD-10-CM | POA: Diagnosis not present
